# Patient Record
Sex: Female | Born: 1980 | Race: Black or African American | Hispanic: No | Marital: Single | Smoking: Current every day smoker
Health system: Southern US, Community
[De-identification: ages and names within clinical notes are randomized; demographics above are authoritative.]

## PROBLEM LIST (undated history)

## (undated) HISTORY — PX: HERNIA REPAIR: SHX51

---

## 2018-09-05 ENCOUNTER — Emergency Department (HOSPITAL_COMMUNITY): Payer: Self-pay

## 2018-09-05 ENCOUNTER — Encounter (HOSPITAL_COMMUNITY): Payer: Self-pay

## 2018-09-05 ENCOUNTER — Emergency Department (HOSPITAL_COMMUNITY)
Admission: EM | Admit: 2018-09-05 | Discharge: 2018-09-05 | Disposition: A | Discharge status: Home / Self Care | Payer: Self-pay | Attending: Emergency Medicine | Admitting: Emergency Medicine

## 2018-09-05 DIAGNOSIS — E871 Hypo-osmolality and hyponatremia: Secondary | ICD-10-CM | POA: Insufficient documentation

## 2018-09-05 DIAGNOSIS — F17210 Nicotine dependence, cigarettes, uncomplicated: Secondary | ICD-10-CM | POA: Insufficient documentation

## 2018-09-05 DIAGNOSIS — J209 Acute bronchitis, unspecified: Secondary | ICD-10-CM | POA: Insufficient documentation

## 2018-09-05 LAB — CBC WITH DIFFERENTIAL/PLATELET
Abs Immature Granulocytes: 0.03 10*3/uL (ref 0.00–0.07)
Basophils Absolute: 0 10*3/uL (ref 0.0–0.1)
Basophils Relative: 0 %
Eosinophils Absolute: 0.2 10*3/uL (ref 0.0–0.5)
Eosinophils Relative: 3 %
HCT: 41.1 % (ref 36.0–46.0)
Hemoglobin: 12.9 g/dL (ref 12.0–15.0)
Immature Granulocytes: 0 %
Lymphocytes Relative: 46 %
Lymphs Abs: 3.5 10*3/uL (ref 0.7–4.0)
MCH: 30.4 pg (ref 26.0–34.0)
MCHC: 31.4 g/dL (ref 30.0–36.0)
MCV: 96.9 fL (ref 80.0–100.0)
Monocytes Absolute: 0.8 10*3/uL (ref 0.1–1.0)
Monocytes Relative: 11 %
Neutro Abs: 3 10*3/uL (ref 1.7–7.7)
Neutrophils Relative %: 40 %
Platelets: 248 10*3/uL (ref 150–400)
RBC: 4.24 MIL/uL (ref 3.87–5.11)
RDW: 12.1 % (ref 11.5–15.5)
WBC: 7.6 10*3/uL (ref 4.0–10.5)
nRBC: 0 % (ref 0.0–0.2)

## 2018-09-05 LAB — BASIC METABOLIC PANEL
Anion gap: 9 (ref 5–15)
BUN: 10 mg/dL (ref 6–20)
CO2: 23 mmol/L (ref 22–32)
Calcium: 8.7 mg/dL — ABNORMAL LOW (ref 8.9–10.3)
Chloride: 100 mmol/L (ref 98–111)
Creatinine, Ser: 0.7 mg/dL (ref 0.44–1.00)
GFR calc Af Amer: >60 mL/min (ref >60)
GFR calc non Af Amer: >60 mL/min (ref >60)
Glucose, Bld: 86 mg/dL (ref 70–99)
Potassium: 4 mmol/L (ref 3.5–5.1)
Sodium: 132 mmol/L — ABNORMAL LOW (ref 135–145)

## 2018-09-05 LAB — I-STAT TROPONIN, ED: TROPONIN I, POC: 0 ng/mL (ref 0.00–0.08)

## 2018-09-05 LAB — D-DIMER, QUANTITATIVE: D-Dimer, Quant: <0.27 ug/mL-FEU (ref 0.00–0.50)

## 2018-09-05 LAB — POC URINE PREG, ED: Preg Test, Ur: NEGATIVE

## 2018-09-05 MED ORDER — SODIUM CHLORIDE 0.9 % IV BOLUS
1000.0000 mL | Freq: Once | INTRAVENOUS | Status: AC
  Administered 2018-09-05: 1000 mL via INTRAVENOUS

## 2018-09-05 MED ORDER — AEROCHAMBER PLUS FLO-VU LARGE MISC
Status: AC
  Administered 2018-09-05: 1
  Filled 2018-09-05: qty 1

## 2018-09-05 MED ORDER — AEROCHAMBER PLUS FLO-VU MEDIUM MISC
1.0000 | Freq: Once | Status: DC
  Filled 2018-09-05: qty 1

## 2018-09-05 MED ORDER — ALBUTEROL SULFATE HFA 108 (90 BASE) MCG/ACT IN AERS
1.0000 | INHALATION_SPRAY | Freq: Once | RESPIRATORY_TRACT | Status: AC
  Administered 2018-09-05: 1 via RESPIRATORY_TRACT
  Filled 2018-09-05: qty 6.7

## 2018-09-05 MED ORDER — ONDANSETRON 4 MG PO TBDP
4.0000 mg | ORAL_TABLET | Freq: Once | ORAL | Status: DC
  Filled 2018-09-05: qty 1

## 2018-09-05 MED ORDER — DM-GUAIFENESIN ER 30-600 MG PO TB12: 1.0000 | ORAL_TABLET | Freq: Two times a day (BID) | ORAL | 0 refills | Status: AC

## 2018-09-05 MED ORDER — NAPROXEN 375 MG PO TABS: 375.0000 mg | ORAL_TABLET | Freq: Two times a day (BID) | ORAL | 0 refills | Status: AC

## 2018-09-05 NOTE — ED Triage Notes (Signed)
Onset 2 weeks productive cough- yellow phlegm, after being caught out in rain.  Chest hurts when coughing and taking deep breaths.  Vomited this morning after smelling something cooking.

## 2018-09-05 NOTE — Discharge Instructions (Addendum)
Please read and follow all provided instructions.  Your diagnoses today include:  1. Acute bronchitis, unspecified organism   2. Hyponatremia     You appear to have an upper respiratory infection (URI). An upper respiratory tract infection, or cold, is a viral infection of the air passages leading to the lungs. It should improve gradually after 5-7 days. You may have a lingering cough that lasts for 2- 4 weeks after the infection.  Tests performed today include: Vital signs. See below for your results today.   Medications prescribed:   Take any prescribed medications only as directed. Treatment for your infection is aimed at treating the symptoms. There are no medications, such as antibiotics, that will cure your infection.   Home care instructions:  Follow any educational materials contained in this packet.   Your illness is contagious and can be spread to others, especially during the first 3 or 4 days. It cannot be cured by antibiotics or other medicines. Take basic precautions such as washing your hands often, covering your mouth when you cough or sneeze, and avoiding public places where you could spread your illness to others.   Please continue drinking plenty of fluids.  Use over-the-counter medicines as needed as directed on packaging for symptom relief.  You may also use ibuprofen or tylenol as directed on packaging for pain or fever.  Do not take multiple medicines containing Tylenol or acetaminophen to avoid taking too much of this medication.  Follow-up instructions: Please follow-up with your primary care provider in the next 3 days for further evaluation of your symptoms if you are not feeling better.   Return instructions:  Please return to the Emergency Department if you experience worsening symptoms.  RETURN IMMEDIATELY IF you develop shortness of breath, chest pain, confusion or altered mental status, a new rash, become dizzy, faint, or poorly responsive, or are unable to  be cared for at home. Please return if you have persistent vomiting and cannot keep down fluids or develop a fever that is not controlled by tylenol or motrin.   Please return if you have any other emergent concerns.  Additional Information:  Your vital signs today were: BP 126/88    Pulse 64    Temp 98.6 F (37 C) (Oral)    Resp (!) 22    LMP 08/09/2018    SpO2 100%  If your blood pressure (BP) was elevated above 135/85 this visit, please have this repeated by your doctor within one month. --------------

## 2018-09-05 NOTE — ED Provider Notes (Signed)
[facility]   CSN: [id number] Arrival date & time: 09/05/18  1841     History   Chief Complaint Chief Complaint  Patient presents with  . Cough    HPI Angela Baxter is a 37 y.o. female.  HPI  Patient is a 37 year old female with no significant past medical history presenting for productive cough, generalized weakness, and nausea and vomiting for the past 2 days.  Patient reports that her symptoms initially began 2 weeks ago with coughing and central chest pain.  She reports this is in the setting of getting went out in the rain, and she feels that she may have fallen ill from this.  She also reports rhinorrhea and congestion at the same time.  Patient has no recorded fevers at home but does report that she has felt chilled daily and is a generalized body aches.  Over the past 2 to 3 days, patient reports that she has had nausea with 2-3 episodes of vomiting.  She denies any known precipitant to the vomiting.  She reports that some foods will make her nauseous some smells, but denies any posttussive emesis.  Patient does also report that she has had shortness of breath with exertion since she is experiences cough.  Patient prior to her illness was a current everyday smoker, and did smoke cigarettes again yesterday.  No history of primary cardiac or lung disease.  Patient denies any history of DVT/PE, hormone use, cancer treatment, hemoptysis, lower extremity edema or calf tenderness, recent surgery, hospitalization, immobilization.   History reviewed. No pertinent past medical history.  There are no active problems to display for this patient.   Past Surgical History:  Procedure Laterality Date  . CESAREAN SECTION    . HERNIA REPAIR     umbilical     OB History   None      Home Medications    Prior to Admission medications   Not on File    Family History History reviewed. No pertinent family history.  Social  History Social History   Tobacco Use  . Smoking status: Current Every Day Smoker    Types: Cigarettes  . Smokeless tobacco: Never Used  . Tobacco comment: 1 pack will last 4 days   Substance Use Topics  . Alcohol use: Yes  . 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   Review of Systems Review of Systems  Constitutional: Negative for chills and fever.  HENT: Positive for congestion and rhinorrhea. Negative for sinus pain and sore throat.   Eyes: Negative for visual disturbance.  Respiratory: Positive for cough, chest tightness and shortness of breath.   Cardiovascular: Negative for chest pain, palpitations and leg swelling.  Gastrointestinal: Positive for nausea and vomiting. Negative for abdominal pain.  Genitourinary: Negative for dysuria and flank pain.  Musculoskeletal: Negative for back pain and myalgias.  Skin: Negative for rash.  Neurological: Negative for dizziness, syncope, light-headedness and headaches.     Physical Exam Updated Vital Signs BP 126/88   Pulse 64   Temp 98.6 F (37 C) (Oral)   Resp (!) 22   LMP 08/09/2018   SpO2 100%   Physical Exam  Constitutional: She appears well-developed and well-nourished. No distress.  HENT:  Head: Normocephalic and atraumatic.  Mouth/Throat: Oropharynx is clear and moist.  Eyes: Pupils are equal, round, and reactive  to light. Conjunctivae and EOM are normal.  Neck: Normal range of motion. Neck supple.  Cardiovascular: Normal rate, regular rhythm, S1 normal and S2 normal.  No murmur heard. No lower extremity edema.  No calf tenderness.  Pulmonary/Chest: Effort normal and breath sounds normal. She has no wheezes. She has no rales.  Cough auscultated on exam.  Patient has a deep, dry sounding cough.  No rhonchi.  Abdominal: Soft. She exhibits no distension. There is no tenderness. There is no guarding.  Musculoskeletal: Normal  range of motion. She exhibits no edema or deformity.  Lymphadenopathy:    She has no cervical adenopathy.  Neurological: She is alert.  Cranial nerves grossly intact. Patient moves extremities symmetrically and with good coordination.  Skin: Skin is warm and dry. No rash noted. No erythema.  Psychiatric: She has a normal mood and affect. Her behavior is normal. Judgment and thought content normal.  Nursing note and vitals reviewed.    ED Treatments / Results  Labs (all labs ordered are listed, but only abnormal results are displayed) Labs Reviewed  BASIC METABOLIC PANEL - Abnormal; Notable for the following components:      Result Value   Sodium 132 (*)    Calcium 8.7 (*)    All other components within normal limits  D-DIMER, QUANTITATIVE (NOT AT [facility])  CBC WITH DIFFERENTIAL/PLATELET  POC URINE PREG, ED  I-STAT TROPONIN, ED    EKG EKG Interpretation  Date/Time:  Saturday September 05 2018 19:25:44 EST Ventricular Rate:  78 PR Interval:    QRS Duration: 91 QT Interval:  388 QTC Calculation: 442 R Axis:   53 Text Interpretation:  Sinus rhythm Confirmed by Benjiman Core [phone]) on 09/05/2018 8:38:39 PM   Radiology Dg Chest 2 View  Result Date: 09/05/2018 CLINICAL DATA:  Short of breath, wheezing EXAM: CHEST - 2 VIEW COMPARISON:  None. FINDINGS: Normal mediastinum and cardiac silhouette. Normal pulmonary vasculature. No evidence of effusion, infiltrate, or pneumothorax. No acute bony abnormality. IMPRESSION: No acute cardiopulmonary process. Electronically Signed   By: Genevive Bi M.D.   On: 09/05/2018 20:13    Procedures Procedures (including critical care time)  Medications Ordered in ED Medications  ondansetron (ZOFRAN-ODT) disintegrating tablet 4 mg (has no administration in time range)  albuterol (PROVENTIL HFA;VENTOLIN HFA) 108 (90 Base) MCG/ACT inhaler 1 puff (has no administration in time range)  sodium chloride 0.9 % bolus 1,000 mL (1,000 mLs  Intravenous New Bag/Given 09/05/18 1956)     Initial Impression / Assessment and Plan / ED Course  I have reviewed the triage vital signs and the nursing notes.  Pertinent labs & imaging results that were available during my care of the patient were reviewed by me and considered in my medical decision making (see chart for details).     Patient is nontoxic-appearing, afebrile, hemodynamically stable and in no acute distress.  Differential diagnosis includes pneumonia, bronchitis, pulmonary embolism, ACS, pericarditis.   Work-up is reassuring.  No leukocytosis.  Patient has a sodium of 132.  No prior for comparison.  Pregnancy test is negative.  Screening troponin is negative.  D-dimer is negative.  Will provide fluid repletion with normal saline.  Do not feel that sodium needs to be rechecked, and this is unlikely to be symptomatic hyponatremia.  EKG normal sinus rhythm without evidence of ischemia, infarction, or arrhythmia.  There are no obvious signs of pericarditis, a history is not consistent with pericarditis.  Suspect bronchitis.  Will give patient albuterol inhaler, encourage Mucinex DM,  and naproxen.  Patient given resources for primary care. Return precautions given for any worsening pain, shortness of breath, fevers, intractable nausea vomiting, or dizziness, lightheadedness or presyncope.  Patient is in understanding and agrees with the plan of care.  Final Clinical Impressions(s) / ED Diagnoses   Final diagnoses:  Acute bronchitis, unspecified organism  Hyponatremia    ED Discharge Orders         Ordered    naproxen (NAPROSYN) 375 MG tablet  2 times daily     09/05/18 2124    dextromethorphan-guaiFENesin (MUCINEX DM) 30-600 MG 12hr tablet  2 times daily     09/05/18 2124           Delia Chimes 09/06/18 0001    Benjiman Core, MD 09/06/18 0003

## 2019-08-01 ENCOUNTER — Emergency Department (HOSPITAL_COMMUNITY)
Admission: EM | Admit: 2019-08-01 | Discharge: 2019-08-02 | Disposition: A | Discharge status: Home / Self Care | Payer: Self-pay | Attending: Emergency Medicine | Admitting: Emergency Medicine

## 2019-08-01 ENCOUNTER — Encounter (HOSPITAL_COMMUNITY): Payer: Self-pay

## 2019-08-01 ENCOUNTER — Other Ambulatory Visit: Payer: Self-pay

## 2019-08-01 ENCOUNTER — Emergency Department (HOSPITAL_COMMUNITY): Payer: Self-pay

## 2019-08-01 DIAGNOSIS — R05 Cough: Secondary | ICD-10-CM | POA: Insufficient documentation

## 2019-08-01 DIAGNOSIS — M7918 Myalgia, other site: Secondary | ICD-10-CM | POA: Insufficient documentation

## 2019-08-01 DIAGNOSIS — Z5321 Procedure and treatment not carried out due to patient leaving prior to being seen by health care provider: Secondary | ICD-10-CM | POA: Insufficient documentation

## 2019-08-01 MED ORDER — ACETAMINOPHEN 325 MG PO TABS
650.0000 mg | ORAL_TABLET | Freq: Once | ORAL | Status: AC | PRN
  Administered 2019-08-01: 650 mg via ORAL
  Filled 2019-08-01: qty 2

## 2019-08-01 NOTE — ED Triage Notes (Signed)
Pt states she has had nausea and chills since Friday. Pt states she has had a productive cough as well. Pt c/o generalized body aches.

## 2021-05-22 ENCOUNTER — Emergency Department (HOSPITAL_COMMUNITY)
Admission: EM | Admit: 2021-05-22 | Discharge: 2021-05-22 | Disposition: A | Discharge status: Home / Self Care | Payer: Self-pay | Attending: Emergency Medicine | Admitting: Emergency Medicine

## 2021-05-22 ENCOUNTER — Emergency Department (HOSPITAL_COMMUNITY): Payer: Self-pay

## 2021-05-22 DIAGNOSIS — S31114A Laceration without foreign body of abdominal wall, left lower quadrant without penetration into peritoneal cavity, initial encounter: Secondary | ICD-10-CM | POA: Insufficient documentation

## 2021-05-22 DIAGNOSIS — S31119A Laceration without foreign body of abdominal wall, unspecified quadrant without penetration into peritoneal cavity, initial encounter: Secondary | ICD-10-CM

## 2021-05-22 DIAGNOSIS — T1490XA Injury, unspecified, initial encounter: Secondary | ICD-10-CM

## 2021-05-22 DIAGNOSIS — F17210 Nicotine dependence, cigarettes, uncomplicated: Secondary | ICD-10-CM | POA: Insufficient documentation

## 2021-05-22 DIAGNOSIS — S299XXA Unspecified injury of thorax, initial encounter: Secondary | ICD-10-CM | POA: Insufficient documentation

## 2021-05-22 DIAGNOSIS — S51812A Laceration without foreign body of left forearm, initial encounter: Secondary | ICD-10-CM | POA: Insufficient documentation

## 2021-05-22 DIAGNOSIS — Z23 Encounter for immunization: Secondary | ICD-10-CM | POA: Insufficient documentation

## 2021-05-22 LAB — COMPREHENSIVE METABOLIC PANEL
ALT: 17 U/L (ref 0–44)
AST: 21 U/L (ref 15–41)
Albumin: 3.8 g/dL (ref 3.5–5.0)
Alkaline Phosphatase: 89 U/L (ref 38–126)
Anion gap: 8 (ref 5–15)
BUN: 12 mg/dL (ref 6–20)
CO2: 23 mmol/L (ref 22–32)
Calcium: 9 mg/dL (ref 8.9–10.3)
Chloride: 106 mmol/L (ref 98–111)
Creatinine, Ser: 0.79 mg/dL (ref 0.44–1.00)
GFR, Estimated: >60 mL/min (ref >60)
Glucose, Bld: 93 mg/dL (ref 70–99)
Potassium: 3.5 mmol/L (ref 3.5–5.1)
Sodium: 137 mmol/L (ref 135–145)
Total Bilirubin: 0.4 mg/dL (ref 0.3–1.2)
Total Protein: 7.7 g/dL (ref 6.5–8.1)

## 2021-05-22 LAB — CBC WITH DIFFERENTIAL/PLATELET
Abs Immature Granulocytes: 0.04 10*3/uL (ref 0.00–0.07)
Basophils Absolute: 0 10*3/uL (ref 0.0–0.1)
Basophils Relative: 0 %
Eosinophils Absolute: 0.1 10*3/uL (ref 0.0–0.5)
Eosinophils Relative: 1 %
HCT: 39.5 % (ref 36.0–46.0)
Hemoglobin: 13 g/dL (ref 12.0–15.0)
Immature Granulocytes: 1 %
Lymphocytes Relative: 27 %
Lymphs Abs: 2.3 10*3/uL (ref 0.7–4.0)
MCH: 31.3 pg (ref 26.0–34.0)
MCHC: 32.9 g/dL (ref 30.0–36.0)
MCV: 95.2 fL (ref 80.0–100.0)
Monocytes Absolute: 0.5 10*3/uL (ref 0.1–1.0)
Monocytes Relative: 6 %
Neutro Abs: 5.5 10*3/uL (ref 1.7–7.7)
Neutrophils Relative %: 65 %
Platelets: 289 10*3/uL (ref 150–400)
RBC: 4.15 MIL/uL (ref 3.87–5.11)
RDW: 12.7 % (ref 11.5–15.5)
WBC: 8.5 10*3/uL (ref 4.0–10.5)
nRBC: 0 % (ref 0.0–0.2)

## 2021-05-22 LAB — I-STAT CHEM 8, ED
BUN: 11 mg/dL (ref 6–20)
Calcium, Ion: 1.17 mmol/L (ref 1.15–1.40)
Chloride: 107 mmol/L (ref 98–111)
Creatinine, Ser: 0.9 mg/dL (ref 0.44–1.00)
Glucose, Bld: 93 mg/dL (ref 70–99)
HCT: 39 % (ref 36.0–46.0)
Hemoglobin: 13.3 g/dL (ref 12.0–15.0)
Potassium: 3.5 mmol/L (ref 3.5–5.1)
Sodium: 140 mmol/L (ref 135–145)
TCO2: 24 mmol/L (ref 22–32)

## 2021-05-22 LAB — I-STAT BETA HCG BLOOD, ED (MC, WL, AP ONLY): I-stat hCG, quantitative: <5 m[IU]/mL (ref <5)

## 2021-05-22 MED ORDER — AMOXICILLIN-POT CLAVULANATE 875-125 MG PO TABS: 1.0000 | ORAL_TABLET | Freq: Two times a day (BID) | ORAL | 0 refills | Status: AC

## 2021-05-22 MED ORDER — IOHEXOL 300 MG/ML  SOLN
100.0000 mL | Freq: Once | INTRAMUSCULAR | Status: AC | PRN
  Administered 2021-05-22: 100 mL via INTRAVENOUS

## 2021-05-22 MED ORDER — LIDOCAINE-EPINEPHRINE (PF) 2 %-1:200000 IJ SOLN
20.0000 mL | Freq: Once | INTRAMUSCULAR | Status: AC
  Administered 2021-05-22: 20 mL
  Filled 2021-05-22: qty 20

## 2021-05-22 MED ORDER — AMOXICILLIN-POT CLAVULANATE 875-125 MG PO TABS
1.0000 | ORAL_TABLET | Freq: Once | ORAL | Status: AC
  Administered 2021-05-22: 1 via ORAL
  Filled 2021-05-22: qty 1

## 2021-05-22 MED ORDER — TETANUS-DIPHTH-ACELL PERTUSSIS 5-2.5-18.5 LF-MCG/0.5 IM SUSY
0.5000 mL | PREFILLED_SYRINGE | Freq: Once | INTRAMUSCULAR | Status: AC
  Administered 2021-05-22: 0.5 mL via INTRAMUSCULAR
  Filled 2021-05-22: qty 0.5
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
